# Patient Record
Sex: Male | Born: 1995 | Hispanic: No | Marital: Single | Smoking: Never smoker
Health system: Southern US, Community
[De-identification: ages and names within clinical notes are randomized; demographics above are authoritative.]

## PROBLEM LIST (undated history)

## (undated) DIAGNOSIS — Z9109 Other allergy status, other than to drugs and biological substances: Secondary | ICD-10-CM

## (undated) DIAGNOSIS — G4730 Sleep apnea, unspecified: Secondary | ICD-10-CM

## (undated) HISTORY — PX: CIRCUMCISION: SUR203

---
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
## 2019-09-07 ENCOUNTER — Ambulatory Visit: Payer: Medicaid Other | Attending: Internal Medicine

## 2019-09-07 ENCOUNTER — Other Ambulatory Visit: Payer: Self-pay

## 2019-09-07 DIAGNOSIS — Z20822 Contact with and (suspected) exposure to covid-19: Secondary | ICD-10-CM

## 2019-09-08 LAB — NOVEL CORONAVIRUS, NAA: SARS-CoV-2, NAA: NOT DETECTED

## 2020-09-05 ENCOUNTER — Ambulatory Visit (HOSPITAL_COMMUNITY)
Admission: RE | Admit: 2020-09-05 | Discharge: 2020-09-05 | Disposition: A | Discharge status: Home / Self Care | Payer: Self-pay | Source: Ambulatory Visit | Attending: Preventative Medicine | Admitting: Preventative Medicine

## 2020-09-05 ENCOUNTER — Other Ambulatory Visit: Payer: Self-pay

## 2020-09-05 ENCOUNTER — Other Ambulatory Visit (HOSPITAL_COMMUNITY): Payer: Self-pay | Admitting: Preventative Medicine

## 2020-09-05 DIAGNOSIS — S29012D Strain of muscle and tendon of back wall of thorax, subsequent encounter: Secondary | ICD-10-CM | POA: Insufficient documentation
